# Patient Record
Sex: Female | Born: 1985 | Race: White | Hispanic: No | Marital: Single
Health system: Southern US, Community
[De-identification: ages and names within clinical notes are randomized; demographics above are authoritative.]

---

## 2015-12-19 ENCOUNTER — Emergency Department (HOSPITAL_COMMUNITY)
Admission: EM | Admit: 2015-12-19 | Discharge: 2015-12-20 | Disposition: A | Discharge status: Home / Self Care | Payer: Self-pay | Attending: Emergency Medicine | Admitting: Emergency Medicine

## 2015-12-19 DIAGNOSIS — Y998 Other external cause status: Secondary | ICD-10-CM | POA: Insufficient documentation

## 2015-12-19 DIAGNOSIS — F1310 Sedative, hypnotic or anxiolytic abuse, uncomplicated: Secondary | ICD-10-CM | POA: Insufficient documentation

## 2015-12-19 DIAGNOSIS — S0101XA Laceration without foreign body of scalp, initial encounter: Secondary | ICD-10-CM | POA: Insufficient documentation

## 2015-12-19 DIAGNOSIS — T50901A Poisoning by unspecified drugs, medicaments and biological substances, accidental (unintentional), initial encounter: Secondary | ICD-10-CM

## 2015-12-19 DIAGNOSIS — Y92481 Parking lot as the place of occurrence of the external cause: Secondary | ICD-10-CM | POA: Insufficient documentation

## 2015-12-19 DIAGNOSIS — Z8659 Personal history of other mental and behavioral disorders: Secondary | ICD-10-CM | POA: Insufficient documentation

## 2015-12-19 DIAGNOSIS — Z3202 Encounter for pregnancy test, result negative: Secondary | ICD-10-CM | POA: Insufficient documentation

## 2015-12-19 DIAGNOSIS — F1410 Cocaine abuse, uncomplicated: Secondary | ICD-10-CM | POA: Insufficient documentation

## 2015-12-19 DIAGNOSIS — T401X1A Poisoning by heroin, accidental (unintentional), initial encounter: Secondary | ICD-10-CM | POA: Insufficient documentation

## 2015-12-19 DIAGNOSIS — Y288XXA Contact with other sharp object, undetermined intent, initial encounter: Secondary | ICD-10-CM | POA: Insufficient documentation

## 2015-12-19 DIAGNOSIS — F1210 Cannabis abuse, uncomplicated: Secondary | ICD-10-CM | POA: Insufficient documentation

## 2015-12-19 DIAGNOSIS — S8002XA Contusion of left knee, initial encounter: Secondary | ICD-10-CM | POA: Insufficient documentation

## 2015-12-19 DIAGNOSIS — F1110 Opioid abuse, uncomplicated: Secondary | ICD-10-CM | POA: Insufficient documentation

## 2015-12-19 DIAGNOSIS — Y9389 Activity, other specified: Secondary | ICD-10-CM | POA: Insufficient documentation

## 2015-12-19 DIAGNOSIS — S8001XA Contusion of right knee, initial encounter: Secondary | ICD-10-CM | POA: Insufficient documentation

## 2015-12-19 NOTE — ED Notes (Signed)
Bed: [id number] Expected date:  Expected time:  Means of arrival:  Comments: EMS 30yo F combative / injected unknown substance
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
## 2015-12-20 ENCOUNTER — Emergency Department (HOSPITAL_COMMUNITY): Payer: Self-pay

## 2015-12-20 LAB — COMPREHENSIVE METABOLIC PANEL
ALT: 17 U/L (ref 14–54)
AST: 24 U/L (ref 15–41)
Albumin: 3.9 g/dL (ref 3.5–5.0)
Alkaline Phosphatase: 67 U/L (ref 38–126)
Anion gap: 8 (ref 5–15)
BUN: 13 mg/dL (ref 6–20)
CHLORIDE: 104 mmol/L (ref 101–111)
CO2: 27 mmol/L (ref 22–32)
CREATININE: 0.87 mg/dL (ref 0.44–1.00)
Calcium: 9.5 mg/dL (ref 8.9–10.3)
GFR calc Af Amer: >60 mL/min (ref >60)
GLUCOSE: 98 mg/dL (ref 65–99)
Potassium: 4.5 mmol/L (ref 3.5–5.1)
Sodium: 139 mmol/L (ref 135–145)
Total Bilirubin: 0.6 mg/dL (ref 0.3–1.2)
Total Protein: 8 g/dL (ref 6.5–8.1)

## 2015-12-20 LAB — CBC WITH DIFFERENTIAL/PLATELET
Basophils Absolute: 0 10*3/uL (ref 0.0–0.1)
Basophils Relative: 0 %
Eosinophils Absolute: 0.1 10*3/uL (ref 0.0–0.7)
Eosinophils Relative: 1 %
HCT: 39.6 % (ref 36.0–46.0)
Hemoglobin: 13.6 g/dL (ref 12.0–15.0)
LYMPHS ABS: 2.7 10*3/uL (ref 0.7–4.0)
LYMPHS PCT: 23 %
MCH: 27.9 pg (ref 26.0–34.0)
MCHC: 34.3 g/dL (ref 30.0–36.0)
MCV: 81.1 fL (ref 78.0–100.0)
MONO ABS: 0.6 10*3/uL (ref 0.1–1.0)
MONOS PCT: 5 %
Neutro Abs: 8.3 10*3/uL — ABNORMAL HIGH (ref 1.7–7.7)
Neutrophils Relative %: 71 %
PLATELETS: 245 10*3/uL (ref 150–400)
RBC: 4.88 MIL/uL (ref 3.87–5.11)
RDW: 14.1 % (ref 11.5–15.5)
WBC: 11.8 10*3/uL — ABNORMAL HIGH (ref 4.0–10.5)

## 2015-12-20 LAB — I-STAT BETA HCG BLOOD, ED (MC, WL, AP ONLY)

## 2015-12-20 LAB — RAPID URINE DRUG SCREEN, HOSP PERFORMED
Amphetamines: NOT DETECTED
BARBITURATES: NOT DETECTED
Benzodiazepines: POSITIVE — AB
Cocaine: POSITIVE — AB
Opiates: POSITIVE — AB
Tetrahydrocannabinol: POSITIVE — AB

## 2015-12-20 LAB — ETHANOL

## 2015-12-20 MED ORDER — LIDOCAINE-EPINEPHRINE 2 %-1:100000 IJ SOLN
10.0000 mL | Freq: Once | INTRAMUSCULAR | Status: DC
  Filled 2015-12-20: qty 1

## 2015-12-20 NOTE — ED Notes (Signed)
MD at bedside. 

## 2015-12-20 NOTE — ED Notes (Signed)
Pt ambulated to the bathroom and gave a urine sample.

## 2015-12-20 NOTE — ED Notes (Signed)
Pt calling a ride.  
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
## 2015-12-20 NOTE — ED Notes (Signed)
Pt left without signing 

## 2015-12-20 NOTE — ED Notes (Signed)
Pt transported to CT ?

## 2015-12-20 NOTE — Discharge Instructions (Signed)
Please go to your Primary Care Physician, an Urgent Care or return to the Emergency Department to have your staples or sutures removed 7 -10 days from today.  Laceration Care, Adult A laceration is a cut that goes through all of the layers of the skin and into the tissue that is right under the skin. Some lacerations heal on their own. Others need to be closed with stitches (sutures), staples, skin adhesive strips, or skin glue. Proper laceration care minimizes the risk of infection and helps the laceration to heal better. HOW TO CARE FOR YOUR LACERATION If sutures or staples were used:  Keep the wound clean and dry.  If you were given a bandage (dressing), you should change it at least one time per day or as told by your health care provider. You should also change it if it becomes wet or dirty.  Keep the wound completely dry for the first 24 hours or as told by your health care provider. After that time, you may shower or bathe. However, make sure that the wound is not soaked in water until after the sutures or staples have been removed.  Clean the wound one time each day or as told by your health care provider:  Wash the wound with soap and water.  Rinse the wound with water to remove all soap.  Pat the wound dry with a clean towel. Do not rub the wound.  After cleaning the wound, apply a thin layer of antibiotic ointmentas told by your health care provider. This will help to prevent infection and keep the dressing from sticking to the wound.  Have the sutures or staples removed as told by your health care provider. If skin adhesive strips were used:  Keep the wound clean and dry.  If you were given a bandage (dressing), you should change it at least one time per day or as told by your health care provider. You should also change it if it becomes dirty or wet.  Do not get the skin adhesive strips wet. You may shower or bathe, but be careful to keep the wound dry.  If the wound  gets wet, pat it dry with a clean towel. Do not rub the wound.  Skin adhesive strips fall off on their own. You may trim the strips as the wound heals. Do not remove skin adhesive strips that are still stuck to the wound. They will fall off in time. If skin glue was used:  Try to keep the wound dry, but you may briefly wet it in the shower or bath. Do not soak the wound in water, such as by swimming.  After you have showered or bathed, gently pat the wound dry with a clean towel. Do not rub the wound.  Do not do any activities that will make you sweat heavily until the skin glue has fallen off on its own.  Do not apply liquid, cream, or ointment medicine to the wound while the skin glue is in place. Using those may loosen the film before the wound has healed.  If you were given a bandage (dressing), you should change it at least one time per day or as told by your health care provider. You should also change it if it becomes dirty or wet.  If a dressing is placed over the wound, be careful not to apply tape directly over the skin glue. Doing that may cause the glue to be pulled off before the wound has healed.  Do  not pick at the glue. The skin glue usually remains in place for 5-10 days, then it falls off of the skin. General Instructions  Take over-the-counter and prescription medicines only as told by your health care provider.  If you were prescribed an antibiotic medicine or ointment, take or apply it as told by your doctor. Do not stop using it even if your condition improves.  To help prevent scarring, make sure to cover your wound with sunscreen whenever you are outside after stitches are removed, after adhesive strips are removed, or when glue remains in place and the wound is healed. Make sure to wear a sunscreen of at least 30 SPF.  Do not scratch or pick at the wound.  Keep all follow-up visits as told by your health care provider. This is important.  Check your wound every  day for signs of infection. Watch for:  Redness, swelling, or pain.  Fluid, blood, or pus.  Raise (elevate) the injured area above the level of your heart while you are sitting or lying down, if possible. SEEK MEDICAL CARE IF:  You received a tetanus shot and you have swelling, severe pain, redness, or bleeding at the injection site.  You have a fever.  A wound that was closed breaks open.  You notice a bad smell coming from your wound or your dressing.  You notice something coming out of the wound, such as wood or glass.  Your pain is not controlled with medicine.  You have increased redness, swelling, or pain at the site of your wound.  You have fluid, blood, or pus coming from your wound.  You notice a change in the color of your skin near your wound.  You need to change the dressing frequently due to fluid, blood, or pus draining from the wound.  You develop a new rash.  You develop numbness around the wound. SEEK IMMEDIATE MEDICAL CARE IF:  You develop severe swelling around the wound.  Your pain suddenly increases and is severe.  You develop painful lumps near the wound or on skin that is anywhere on your body.  You have a red streak going away from your wound.  The wound is on your hand or foot and you cannot properly move a finger or toe.  The wound is on your hand or foot and you notice that your fingers or toes look pale or bluish.   This information is not intended to replace advice given to you by your health care provider. Make sure you discuss any questions you have with your health care provider.   Document Released: 08/27/2005 Document Revised: 01/11/2015 Document Reviewed: 08/23/2014 Elsevier Interactive Patient Education 2016 Elsevier Inc.   Contusion A contusion is a deep bruise. Contusions are the result of a blunt injury to tissues and muscle fibers under the skin. The injury causes bleeding under the skin. The skin overlying the contusion  may turn blue, purple, or yellow. Minor injuries will give you a painless contusion, but more severe contusions may stay painful and swollen for a few weeks.  CAUSES  This condition is usually caused by a blow, trauma, or direct force to an area of the body. SYMPTOMS  Symptoms of this condition include:  Swelling of the injured area.  Pain and tenderness in the injured area.  Discoloration. The area may have redness and then turn blue, purple, or yellow. DIAGNOSIS  This condition is diagnosed based on a physical exam and medical history. An X-ray, CT scan,  or MRI may be needed to determine if there are any associated injuries, such as broken bones (fractures). TREATMENT  Specific treatment for this condition depends on what area of the body was injured. In general, the best treatment for a contusion is resting, icing, applying pressure to (compression), and elevating the injured area. This is often called the RICE strategy. Over-the-counter anti-inflammatory medicines may also be recommended for pain control.  HOME CARE INSTRUCTIONS   Rest the injured area.  If directed, apply ice to the injured area:  Put ice in a plastic bag.  Place a towel between your skin and the bag.  Leave the ice on for 20 minutes, 2-3 times per day.  If directed, apply light compression to the injured area using an elastic bandage. Make sure the bandage is not wrapped too tightly. Remove and reapply the bandage as directed by your health care provider.  If possible, raise (elevate) the injured area above the level of your heart while you are sitting or lying down.  Take over-the-counter and prescription medicines only as told by your health care provider. SEEK MEDICAL CARE IF:  Your symptoms do not improve after several days of treatment.  Your symptoms get worse.  You have difficulty moving the injured area. SEEK IMMEDIATE MEDICAL CARE IF:   You have severe pain.  You have numbness in a hand or  foot.  Your hand or foot turns pale or cold.   This information is not intended to replace advice given to you by your health care provider. Make sure you discuss any questions you have with your health care provider.   Document Released: 06/06/2005 Document Revised: 05/18/2015 Document Reviewed: 01/12/2015 Elsevier Interactive Patient Education [employer]   Drug Overdose Drug overdose happens when you take too much of a drug. An overdose can occur with illegal drugs, prescription drugs, or over-the-counter (OTC) drugs. The effects of drug overdose can be mild, dangerous, or even deadly. CAUSES Drug overdose may be caused by:  Taking too much of a drug on purpose.  Taking too much of a drug by accident.  An error made by a health care provider who prescribes a drug.  An error made by a pharmacist who fills the prescription order. Drugs that commonly cause overdose include:  Mental health drugs.  Pain medicines.  Illegal drugs.  OTC cough and cold medicines.  Heart medicines.  Seizure medicines. RISK FACTORS Drug overdose is more likely in:  Children. They may be attracted to colorful pills. Because of children's small size, even a small amount of a drug can be dangerous.  Elderly people. They may be taking many different drugs. Elderly people may have difficulty reading labels or remembering when they last took their medicine. The risk of drug overdose is also higher for someone who:  Takes illegal drugs.  Takes a drug and drinks alcohol.  Has a mental health condition. SYMPTOMS Signs and symptoms of drug overdose depend on the drug and the amount that was taken. Common danger signs include:  Behavior changes.  Sleepiness.  Slowed breathing.  Nausea and vomiting.  Seizures.  Changes in eye pupil size (very large or very small). If there are signs of very low blood pressure from a drug overdose (shock), emergency treatment is required. These  signs include:  Cold and clammy skin.  Pale skin.  Blue lips.  Very slow breathing.  Extreme sleepiness.  Loss of consciousness. DIAGNOSIS Drug overdose may be diagnosed based on your symptoms. It  is important that you tell your health care provider:  All of the drugs that you have taken.  When you took the drugs.  Whether you were drinking alcohol. Your health care provider will do a physical exam. This exam may include:  Checking and monitoring your heart rate and rhythm, your temperature, and your blood pressure (vital signs).  Checking your breathing and oxygen level. You may also have tests, including:   Urine tests to check for drugs in your system.  Blood tests to check for:  Drugs in your system.  Signs of an imbalance of your blood minerals (electrolytes).  Liver damage.  Kidney damage. TREATMENT Supporting your vital signs and your breathing is the first step in treating a drug overdose. Treatment may also include:  Receiving fluids and electrolytes through an IV tube.  Having a breathing tube (endotracheal tube) inserted in your airway to help you breathe.  Having a tube passed through your nose and into your stomach (nasogastric tube) to wash out your stomach.  Medicines. You may get medicines to:  Make you vomit.  Absorb any medicine that is left in your digestive system (activated charcoal).  Block or reverse the effect of the drug that caused the overdose.  Having your blood filtered through an artificial kidney machine (hemodialysis). You may need this if your overdose is severe or if you have kidney failure.  Having ongoing counseling and mental health support if you intentionally overdosed or used an illegal drug. HOME CARE INSTRUCTIONS  Take medicines only as directed by your health care provider. Always ask your health care provider to discuss the possible side effects of any new drug that you start taking.  Keep a list of all of the  drugs that you take, including over-the-counter medicines. Bring this list with you to all of your medical visits.  Read the drug inserts that come with your medicines.  Do not use illegal drugs.  Do not drink alcohol when taking drugs.  Store all medicines in safety containers that are out of the reach of children.  Keep the phone number of your local poison control center near your phone or on your cell phone.  Get help if you are struggling with alcohol or drug use.  Get help if you are struggling with depression or another mental health problem.  Keep all follow-up visits as directed by your health care provider. This is important. SEEK MEDICAL CARE IF:  Your symptoms return.  You develop any new signs or symptoms when you are taking medicines. SEEK IMMEDIATE MEDICAL CARE IF:  You think that you or someone else may have taken too much of a drug. The hotline of the [facility] is 780-810-8267.  You or someone else is having symptoms of a drug overdose.  You have serious thoughts about hurting yourself or others.  You have chest pain.  You have difficulty breathing.  You have a loss of consciousness. Drug overdose is an emergency. Do not wait to see if the symptoms will go away. Get medical help right away. Call your local emergency services (911 in the [place] Do not drive yourself to the hospital.   This information is not intended to replace advice given to you by your health care provider. Make sure you discuss any questions you have with your health care provider.   Document Released: 01/11/2015 Document Reviewed: 01/11/2015 Elsevier Interactive Patient Education 2016 [employer].  Substance Abuse Treatment Programs  Intensive Outpatient Programs [facility]     [address]      [place], [place]                   [phone]       [facility] [address] [place],  [place] [phone]  [facility]     (Inpatient and outpatient)     [address]           [phone]    [facility] [phone] (Suboxone and Methadone)  [address]      [place], [place] [address]      [phone]       [address] [place], [place] [phone]  [facility] (Outpatient/Inpatient, [facility])    (insurance only) [phone]             [facility] (Groups & Residential) [place], [place] [phone]     [facility]     [address]     [place], [place]      [phone]       [facility] (for caregivers and family) [phone] [address] [place], [place] [phone]      Residential Treatment Programs [facility]      [address], [place], [place] [address]  [phone]       [facility] [address] [place], [place] [address] [phone]  [facility]        [phone]       [facility] [phone]  [facility]             [address]                                         [place], [place]                                                [phone] or [phone]                               [facility] [place] [address] [place], [address] [phone]  [facility]    [address]      [place], [place]     [phone]       [facility] [address] [place], [place] [phone]  [facility]   [address]     [place], [place] [address]     [phone]      Admissions: 8am-3pm M-F  [facility] ([facility]) [address] [place], [place] [phone]  [facility]: Residential Program [id number](90 Days)   [place], [place]      [phone] or [phone]     [facility]: [facility] [place], [place] (Walk in Hours over the weekend or by referral)  [facility]  [facility] [address] [place], [place], [place] [address] [phone]  Crisis Mobile: [facility]:  [phone] (for crisis response 24 hours a day) [facility] Hotline:      [phone] Outpatient Psychiatry and Counseling  [facility]: Mobile Crisis Management 24 hours:  [phone]  [facility]  of the [place] sliding scale fee and walk in schedule: M-F 8am-12pm/1pm-3pm [address]  [place], [place] [address] [phone]  [facility] [address] [place], [place] [address] [phone]  [facility] ([facility] [employer])- new patient walk-in appointments available Monday - Friday 8am -3pm.          [address] [place], [place] [address] [phone] or crisis line- [phone]  [facility]/ Intensive Outpatient Therapy Program [address] [place], [place] [address] [phone]  [facility]                  Crisis Services      [phone] N[address]     [place], [place] [address]                 [facility]   [facility] [phone]. [address] [place], [place] [address]   [employer] of Care          [address]  [place], [place] [address]       [phone]  [facility] [address] [place], [place] [address] [phone]  [facility]    [address]    [place], [place] [address]     [phone]       Andee Poles, MD     [address]     [place] [address]     [phone]       [facility] [address] [place] [address]  [facility]     [address] [place], [place]      [phone]       [facility] Eulogio Ditch, MD [address] [place], [place] [address] [phone]  [facility]     [address]     [place], [place] [address]     [phone]       [facility] [address] [place], [place] [address] [phone] Resources for Temporary Residential Assistance/Crisis Centers  [facility] CENT[facility]) M-F 8am-3pm   [address] [place], [place] [address]   [phone] Services include: laundry, barbering, support groups, case management, phone  & computer access, showers, AA/NA mtgs, mental health/substance abuse nurse, job skills class, disability information, VA assistance, spiritual classes, etc.   HOMELESS SHELTERS  [facility]     [facility]   [address] [place]     [phone]              [employer] (women and children)       [address] [place], [place] [address] [phone] [email or web address] for application and process Application Required  [facility] [employer] [facility]   [address]    [place] [address]     [phone]                    [facility] [place] [address] [place], [place] [address] [phone] application appt.) Application Required  [facility] (women only)    [address]     [place], [place] [address]     [phone]      Intake starts 6pm daily Need valid ID,  SSC, & Police report [employer] [address] [place], [place] [phone] Application Required  [employer] (men only)     [address]      [place], [place]     [phone]       [facility] [place] (Pregnant women only) [address] [place], [place] [phone]  [facility]      [address]      [place], [place] [address]     [phone]             [facility] [address] [place], [place] [phone] 90 day commitment/SA/Application process  [facility](men only)     [address]     [place], [place]     [phone]       Check-in at [facility] [address] [place], [place] [address] [phone] Men/Women/Women and Children must be there by 7 pm  [facility] [place], [place] [phone]

## 2015-12-20 NOTE — ED Notes (Signed)
PT ambulated to restroom and attempted to get urine sample. Pt states "it wont come out."

## 2015-12-20 NOTE — ED Notes (Signed)
Suture cart and  Lidocaine at bedside 
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
